# Patient Record
Sex: Female | Born: 1950 | Race: White | Hispanic: No | Marital: Single
Health system: Southern US, Community
[De-identification: ages and names within clinical notes are randomized; demographics above are authoritative.]

---

## 2002-06-24 ENCOUNTER — Encounter: Payer: Self-pay | Admitting: Orthopedic Surgery

## 2002-06-24 ENCOUNTER — Encounter: Admission: RE | Admit: 2002-06-24 | Discharge: 2002-06-24 | Payer: Self-pay | Admitting: Orthopedic Surgery

## 2007-12-04 ENCOUNTER — Encounter
Admission: RE | Admit: 2007-12-04 | Discharge: 2007-12-04 | Payer: Self-pay | Admitting: Physical Medicine and Rehabilitation

## 2008-09-05 ENCOUNTER — Encounter: Admission: RE | Admit: 2008-09-05 | Discharge: 2008-09-05 | Payer: Self-pay | Admitting: Orthopedic Surgery

## 2009-04-16 ENCOUNTER — Encounter: Admission: RE | Admit: 2009-04-16 | Discharge: 2009-04-16 | Payer: Self-pay | Admitting: Orthopedic Surgery

## 2009-07-01 ENCOUNTER — Ambulatory Visit (HOSPITAL_BASED_OUTPATIENT_CLINIC_OR_DEPARTMENT_OTHER): Admission: RE | Admit: 2009-07-01 | Discharge: 2009-07-01 | Payer: Self-pay | Admitting: Orthopedic Surgery

## 2010-08-10 ENCOUNTER — Inpatient Hospital Stay (HOSPITAL_COMMUNITY): Admission: RE | Admit: 2010-08-10 | Discharge: 2010-08-14 | Payer: Self-pay | Admitting: Orthopedic Surgery

## 2011-03-04 LAB — CBC
Hemoglobin: 11.2 g/dL — ABNORMAL LOW (ref 12.0–15.0)
MCH: 26.6 pg (ref 26.0–34.0)
MCH: 26.7 pg (ref 26.0–34.0)
MCHC: 32.7 g/dL (ref 30.0–36.0)
MCHC: 33 g/dL (ref 30.0–36.0)
MCHC: 33.1 g/dL (ref 30.0–36.0)
MCHC: 33.2 g/dL (ref 30.0–36.0)
Platelets: 202 10*3/uL (ref 150–400)
Platelets: 246 10*3/uL (ref 150–400)
RBC: 3.97 MIL/uL (ref 3.87–5.11)
RDW: 16.2 % — ABNORMAL HIGH (ref 11.5–15.5)
RDW: 16.3 % — ABNORMAL HIGH (ref 11.5–15.5)

## 2011-03-04 LAB — PROTIME-INR
INR: 1.23 (ref 0.00–1.49)
INR: 1.29 (ref 0.00–1.49)
Prothrombin Time: 14.2 seconds (ref 11.6–15.2)
Prothrombin Time: 15.7 seconds — ABNORMAL HIGH (ref 11.6–15.2)
Prothrombin Time: 16.3 seconds — ABNORMAL HIGH (ref 11.6–15.2)

## 2011-03-04 LAB — COMPREHENSIVE METABOLIC PANEL
AST: 18 U/L (ref 0–37)
Albumin: 3.4 g/dL — ABNORMAL LOW (ref 3.5–5.2)
Alkaline Phosphatase: 101 U/L (ref 39–117)
Chloride: 109 mEq/L (ref 96–112)
GFR calc Af Amer: >60 mL/min (ref >60)
Potassium: 4.8 mEq/L (ref 3.5–5.1)
Sodium: 139 mEq/L (ref 135–145)
Total Bilirubin: 0.5 mg/dL (ref 0.3–1.2)

## 2011-03-04 LAB — BASIC METABOLIC PANEL
BUN: 6 mg/dL (ref 6–23)
CO2: 23 mEq/L (ref 19–32)
Calcium: 9 mg/dL (ref 8.4–10.5)
Calcium: 9.1 mg/dL (ref 8.4–10.5)
Creatinine, Ser: 0.64 mg/dL (ref 0.4–1.2)
Creatinine, Ser: 0.68 mg/dL (ref 0.4–1.2)
GFR calc Af Amer: >60 mL/min (ref >60)
GFR calc non Af Amer: >60 mL/min (ref >60)
Glucose, Bld: 130 mg/dL — ABNORMAL HIGH (ref 70–99)
Sodium: 139 mEq/L (ref 135–145)

## 2011-03-04 LAB — SURGICAL PCR SCREEN: MRSA, PCR: NEGATIVE
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
## 2012-07-10 IMAGING — CR DG KNEE 1-2V*R*
2 series · 2 of 2 positions shown · non-contrast
Comparison: MRI right knee 09/05/2008.

CLINICAL DATA: Right knee replacement surgery.

RIGHT KNEE - 1-2 VIEW

[view not recorded (1 of 2)]
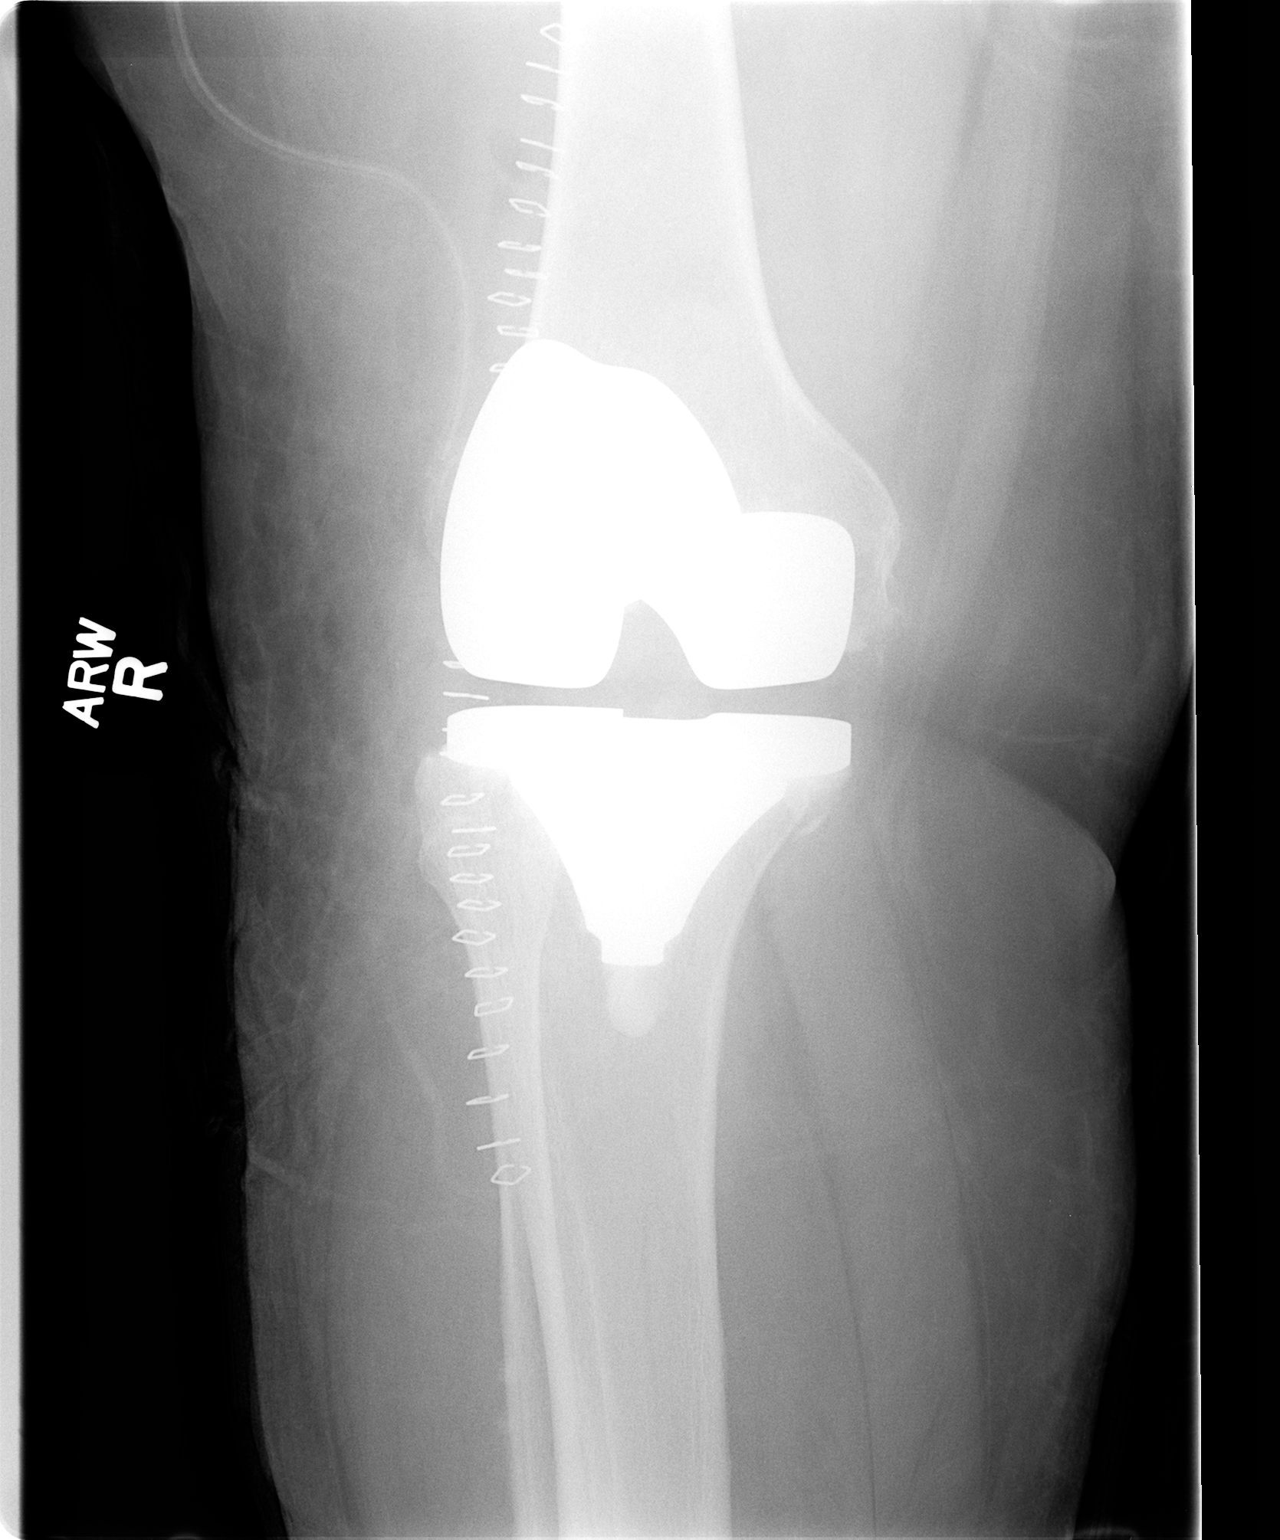

[view not recorded (2 of 2)]
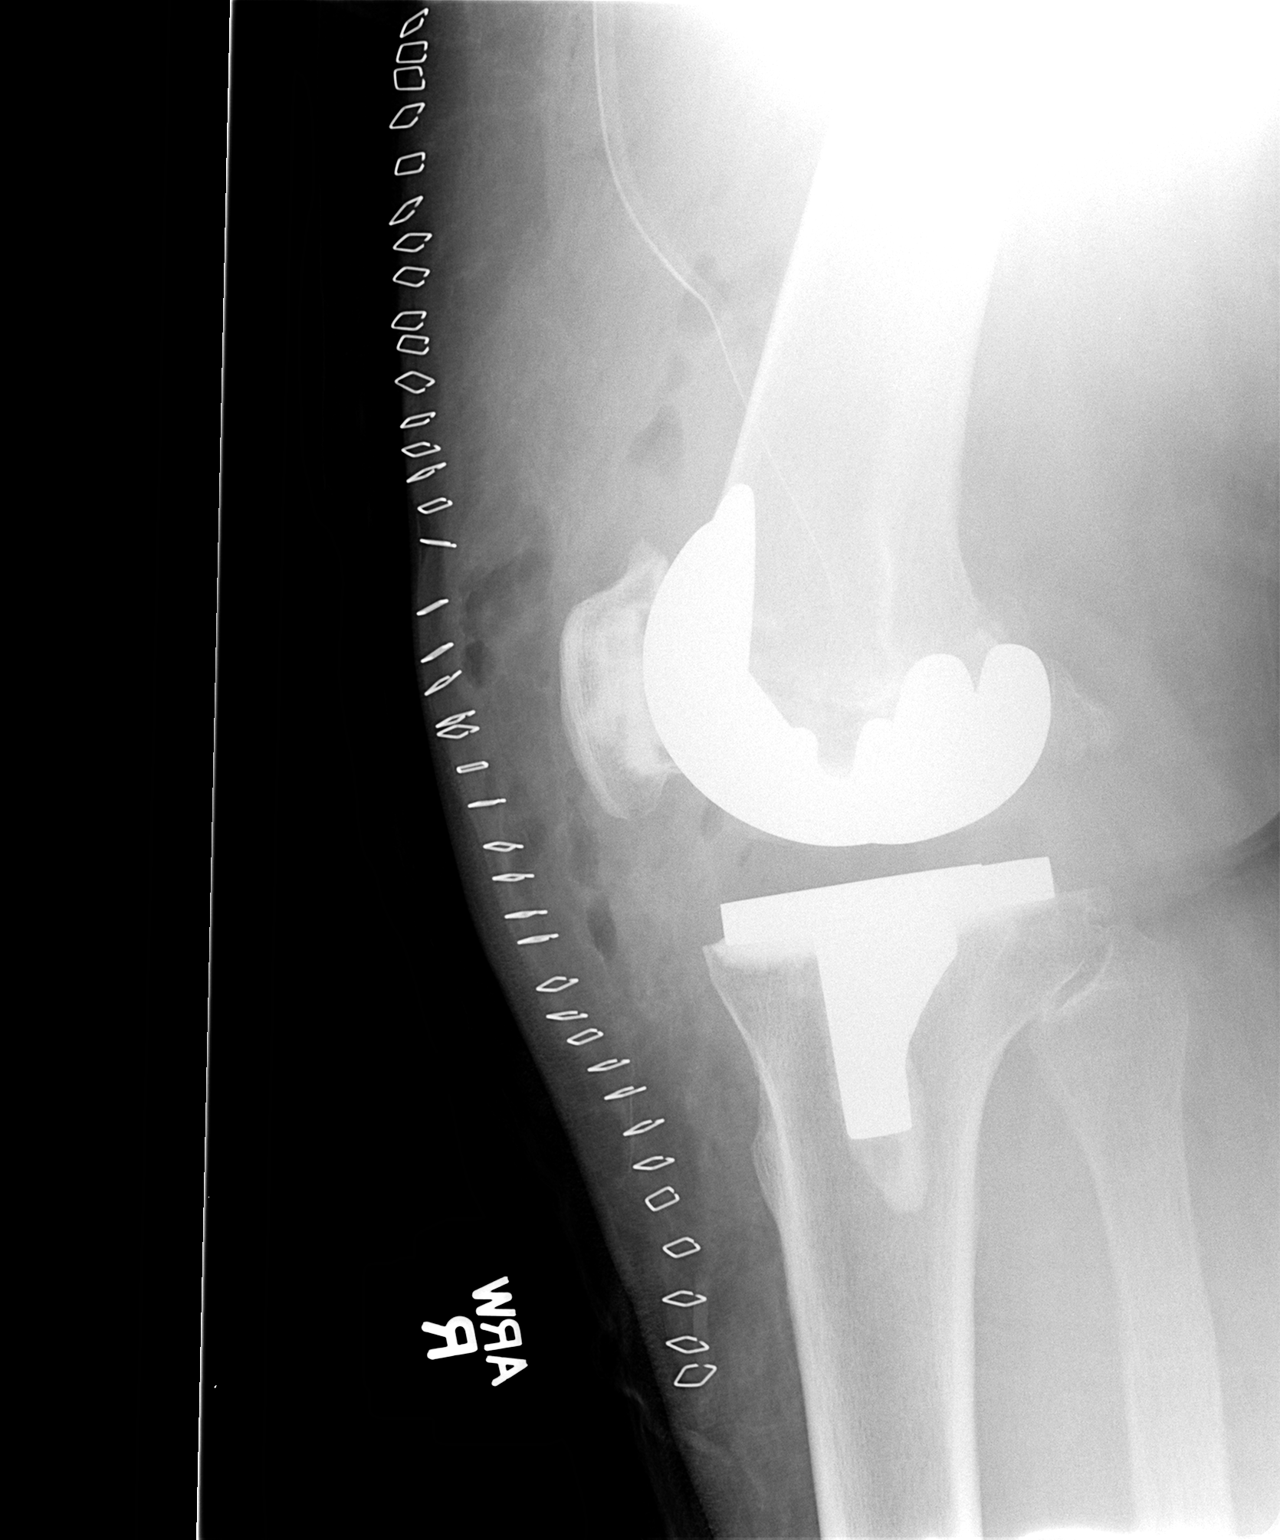

[2 of 2 positions shown; findings below may reference images not displayed]

FINDINGS: There are well seated components of a total right knee
arthroplasty.  No complicating features are demonstrated.
IMPRESSION: Well seated components of a right knee arthroplasty.

## 2016-07-20 ENCOUNTER — Encounter: Payer: Self-pay | Admitting: Gastroenterology

## 2016-07-20 NOTE — Telephone Encounter (Signed)
rro

## 2023-08-24 LAB — COLOGUARD: COLOGUARD: NEGATIVE
